# Patient Record
Sex: Male | Born: 1965 | Race: White | Hispanic: No | Marital: Married | Smoking: Never smoker
Health system: Southern US, Community
[De-identification: ages and names within clinical notes are randomized; demographics above are authoritative.]

## PROBLEM LIST (undated history)

## (undated) DIAGNOSIS — J189 Pneumonia, unspecified organism: Secondary | ICD-10-CM

## (undated) HISTORY — PX: EYE SURGERY: SHX253

---

## 2014-03-04 ENCOUNTER — Emergency Department (HOSPITAL_COMMUNITY)
Admission: EM | Admit: 2014-03-04 | Discharge: 2014-03-04 | Disposition: A | Discharge status: Home / Self Care | Payer: BC Managed Care – PPO | Attending: Emergency Medicine | Admitting: Emergency Medicine

## 2014-03-04 ENCOUNTER — Encounter (HOSPITAL_COMMUNITY): Payer: Self-pay | Admitting: Emergency Medicine

## 2014-03-04 ENCOUNTER — Emergency Department (HOSPITAL_COMMUNITY): Payer: BC Managed Care – PPO

## 2014-03-04 DIAGNOSIS — Z8701 Personal history of pneumonia (recurrent): Secondary | ICD-10-CM | POA: Insufficient documentation

## 2014-03-04 DIAGNOSIS — R030 Elevated blood-pressure reading, without diagnosis of hypertension: Secondary | ICD-10-CM | POA: Diagnosis not present

## 2014-03-04 DIAGNOSIS — R42 Dizziness and giddiness: Secondary | ICD-10-CM | POA: Insufficient documentation

## 2014-03-04 DIAGNOSIS — IMO0001 Reserved for inherently not codable concepts without codable children: Secondary | ICD-10-CM

## 2014-03-04 DIAGNOSIS — Z7982 Long term (current) use of aspirin: Secondary | ICD-10-CM | POA: Diagnosis not present

## 2014-03-04 DIAGNOSIS — R079 Chest pain, unspecified: Secondary | ICD-10-CM

## 2014-03-04 HISTORY — DX: Pneumonia, unspecified organism: J18.9

## 2014-03-04 LAB — COMPREHENSIVE METABOLIC PANEL
ALK PHOS: 64 U/L (ref 39–117)
ALT: 26 U/L (ref 0–53)
AST: 18 U/L (ref 0–37)
Albumin: 3.9 g/dL (ref 3.5–5.2)
Anion gap: 12 (ref 5–15)
BILIRUBIN TOTAL: 0.2 mg/dL — AB (ref 0.3–1.2)
BUN: 15 mg/dL (ref 6–23)
CHLORIDE: 105 meq/L (ref 96–112)
CO2: 25 meq/L (ref 19–32)
CREATININE: 0.78 mg/dL (ref 0.50–1.35)
Calcium: 9.1 mg/dL (ref 8.4–10.5)
GFR calc Af Amer: >90 mL/min (ref >90)
Glucose, Bld: 106 mg/dL — ABNORMAL HIGH (ref 70–99)
Potassium: 4.2 mEq/L (ref 3.7–5.3)
Sodium: 142 mEq/L (ref 137–147)
Total Protein: 7.2 g/dL (ref 6.0–8.3)

## 2014-03-04 LAB — CBC
HCT: 40.4 % (ref 39.0–52.0)
Hemoglobin: 13.8 g/dL (ref 13.0–17.0)
MCH: 28.4 pg (ref 26.0–34.0)
MCHC: 34.2 g/dL (ref 30.0–36.0)
MCV: 83.1 fL (ref 78.0–100.0)
PLATELETS: 210 10*3/uL (ref 150–400)
RBC: 4.86 MIL/uL (ref 4.22–5.81)
RDW: 12.5 % (ref 11.5–15.5)
WBC: 8.4 10*3/uL (ref 4.0–10.5)

## 2014-03-04 LAB — I-STAT TROPONIN, ED
Troponin i, poc: 0 ng/mL (ref 0.00–0.08)
Troponin i, poc: 0 ng/mL (ref 0.00–0.08)

## 2014-03-04 MED ORDER — LORAZEPAM 2 MG/ML IJ SOLN
1.0000 mg | Freq: Once | INTRAMUSCULAR | Status: AC
  Administered 2014-03-04: 1 mg via INTRAVENOUS
  Filled 2014-03-04: qty 1

## 2014-03-04 MED ORDER — ONDANSETRON HCL 4 MG/2ML IJ SOLN
4.0000 mg | Freq: Once | INTRAMUSCULAR | Status: AC
  Administered 2014-03-04: 4 mg via INTRAVENOUS
  Filled 2014-03-04: qty 2

## 2014-03-04 MED ORDER — SODIUM CHLORIDE 0.9 % IV BOLUS (SEPSIS)
1000.0000 mL | Freq: Once | INTRAVENOUS | Status: AC
  Administered 2014-03-04: 1000 mL via INTRAVENOUS

## 2014-03-04 MED ORDER — SODIUM CHLORIDE 0.9 % IV SOLN
INTRAVENOUS | Status: DC
  Administered 2014-03-04: 11:00:00 via INTRAVENOUS

## 2014-03-04 MED ORDER — LORAZEPAM 1 MG PO TABS: 1.0000 mg | ORAL_TABLET | Freq: Three times a day (TID) | ORAL | Status: AC | PRN

## 2014-03-04 MED ORDER — GI COCKTAIL ~~LOC~~
30.0000 mL | Freq: Once | ORAL | Status: AC
  Administered 2014-03-04: 30 mL via ORAL
  Filled 2014-03-04: qty 30

## 2014-03-04 NOTE — ED Provider Notes (Signed)
CSN: [id number]     Arrival date & time 03/04/14  0865 History   First MD Initiated Contact with Patient 03/04/14 0930     Chief Complaint  Patient presents with  . Chest Pain     (Consider location/radiation/quality/duration/timing/severity/associated sxs/prior Treatment) HPI  Nathaniel Glover is a 48 y.o. male who presents for evaluation of dizziness and vomiting. He also has a sensation of chest pain, which has been intermittent since last night. He noticed it about 5:30 PM after working in the yard, and cleaning gutters around his house. He is able to eat and took some aspirin in case it was a heart problem. He was awakened at 4:30 PM by a sensation of nausea and dizziness. His chest pain. Also returned at that time. His dizziness was worse with movement of the head. He has a mild headache at this time. His chest pain has been persistent since 4:30 AM. It is mild. He has not had this problem previously. He could work this morning, but began vomiting, so he called an ambulance and came here by EMS. He was treated with IV fluids, and and Zofran. He feels somewhat better after the Zofran. He denies recent cough, weakness, dizziness, back, pain or similar symptoms in the past. There are no other known modifying factors.   Past Medical History  Diagnosis Date  . Pneumonia    History reviewed. No pertinent past surgical history. History reviewed. No pertinent family history. History  Substance Use Topics  . Smoking status: Never Smoker   . Smokeless tobacco: Not on file  . Alcohol Use: No    Review of Systems  All other systems reviewed and are negative.     Allergies  Review of patient's allergies indicates no known allergies.  Home Medications   Prior to Admission medications   Medication Sig Start Date End Date Taking? Authorizing Provider  aspirin 325 MG tablet Take 650 mg by mouth once.   Yes Historical Provider, MD  LORazepam (ATIVAN) 1 MG tablet Take 1 tablet (1 mg total)  by mouth 3 (three) times daily as needed (Dizziness). Do not take within 6 hours of driving, or working 7/84/69   Flint Melter, MD   BP 148/85  Pulse 58  Temp(Src) 97.8 F (36.6 C) (Oral)  Resp 20  Ht  (1.778 m)  Wt 267 lb (121.11 kg)  BMI 38.31 kg/m2  SpO2 97% Physical Exam  Nursing note and vitals reviewed. Constitutional: He is oriented to person, place, and time. He appears well-developed and well-nourished.  HENT:  Head: Normocephalic and atraumatic.  Right Ear: External ear normal.  Left Ear: External ear normal.  Eyes: Conjunctivae and EOM are normal. Pupils are equal, round, and reactive to light.  Neck: Normal range of motion and phonation normal. Neck supple.  Cardiovascular: Normal rate, regular rhythm and normal heart sounds.   Pulmonary/Chest: Effort normal and breath sounds normal. No respiratory distress. He has no rales. He exhibits no tenderness and no bony tenderness.  Abdominal: Soft. There is no tenderness.  Musculoskeletal: Normal range of motion.  Neurological: He is alert and oriented to person, place, and time. No cranial nerve deficit or sensory deficit. He exhibits normal muscle tone. Coordination normal.  No dysarthria, aphasia or nystagmus. Head impulse testing causes dizziness, but not nystagmus.  Skin: Skin is warm, dry and intact.  Psychiatric: He has a normal mood and affect. His behavior is normal. Judgment and thought content normal.    ED Course  Procedures (including critical care time)  Medications  0.9 %  sodium chloride infusion ( Intravenous New Bag/Given 03/04/14 1044)  sodium chloride 0.9 % bolus 1,000 mL (0 mLs Intravenous Stopped 03/04/14 1044)  LORazepam (ATIVAN) injection 1 mg (1 mg Intravenous Given 03/04/14 0949)  gi cocktail (Maalox,Lidocaine,Donnatal) (30 mLs Oral Given 03/04/14 1321)  ondansetron (ZOFRAN) injection 4 mg (4 mg Intravenous Given 03/04/14 1321)    Patient Vitals for the past 24 hrs:  BP Temp Temp src Pulse  Resp SpO2 Height Weight  03/04/14 1400 148/85 mmHg - - 58 20 97 % - -  03/04/14 1330 160/94 mmHg - - 61 17 100 % - -  03/04/14 1300 155/87 mmHg - - 58 21 100 % - -  03/04/14 1230 160/78 mmHg - - 68 18 98 % - -  03/04/14 1200 148/91 mmHg - - 64 12 100 % - -  03/04/14 1045 157/95 mmHg - - 65 19 100 % - -  03/04/14 1000 159/91 mmHg - - 64 11 98 % - -  03/04/14 0945 153/99 mmHg - - 64 12 100 % - -  03/04/14 0930 157/99 mmHg - - 65 13 99 % - -  03/04/14 0925 162/93 mmHg 97.8 F (36.6 C) Oral - 17 100 %  (1.778 m) 267 lb (121.11 kg)    2:47 PM Reevaluation with update and discussion. After initial assessment and treatment, an updated evaluation reveals he is comfortable, no dizziness, or chest pain at this time. Findings discussed with the patient and his wife, all questions answered. [name] L    Labs Review Labs Reviewed  COMPREHENSIVE METABOLIC PANEL - Abnormal; Notable for the following:    Glucose, Bld 106 (*)    Total Bilirubin 0.2 (*)    All other components within normal limits  CBC  I-STAT TROPOININ, ED  Rosezena Sensor, ED    Imaging Review Dg Chest 2 View  03/04/2014   CLINICAL DATA:  Left chest pain.  EXAM: CHEST  2 VIEW  COMPARISON:  None.  FINDINGS: Mediastinum and hilar structures are normal. Heart size and pulmonary vascularity normal. Lungs are clear. No pleural effusion or pneumothorax. Prominent degenerative changes thoracic spine. No acute bony abnormality.  IMPRESSION: 1. No acute cardiopulmonary disease. 2. Prominent degenerative changes thoracic spine.   Electronically Signed   By: Maisie Fus  Register   On: 03/04/2014 12:02     EKG Interpretation   Date/Time:  Monday March 04 2014 09:27:02 EDT Ventricular Rate:  61 PR Interval:  188 QRS Duration: 93 QT Interval:  389 QTC Calculation: 392 R Axis:   63 Text Interpretation:  Sinus rhythm Minimal ST elevation, inferior leads No  old tracing to compare Confirmed by [facility]  MD, Joshawn Crissman [phone]) on   03/04/2014 9:31:10 AM      MDM   Final diagnoses:  Chest pain, unspecified chest pain type  Dizziness  Elevated blood pressure    Nonspecific dizziness, and chest pain. Incidental hypertension that appears unrelated. I don't see any ACS, metabolic instability, or serious bacterial infection.  Nursing Notes Reviewed/ Care Coordinated Applicable Imaging Reviewed Interpretation of Laboratory Data incorporated into ED treatment  The patient appears reasonably screened and/or stabilized for discharge and I doubt any other medical condition or other [facility] requiring further screening, evaluation, or treatment in the ED at this time prior to discharge.  Plan: Home Medications- Ativan; Home Treatments- rest; return here if the recommended treatment, does not improve the symptoms; Recommended follow up- PCP of  choice 2 weeks for BP check and f/u care    Flint Melter, MD 03/04/14 [phone]

## 2014-03-04 NOTE — Discharge Instructions (Signed)
Try to get plenty of rest, eat and drink regularly. Do not use the medication for dizziness within 6 hours of driving. Followup with a primary care Dr., for a blood pressure check, in 2 or 3 weeks.     Chest Pain (Nonspecific) It is often hard to give a specific diagnosis for the cause of chest pain. There is always a chance that your pain could be related to something serious, such as a heart attack or a blood clot in the lungs. You need to follow up with your health care provider for further evaluation. CAUSES   Heartburn.  Pneumonia or bronchitis.  Anxiety or stress.  Inflammation around your heart (pericarditis) or lung (pleuritis or pleurisy).  A blood clot in the lung.  A collapsed lung (pneumothorax). It can develop suddenly on its own (spontaneous pneumothorax) or from trauma to the chest.  Shingles infection (herpes zoster virus). The chest wall is composed of bones, muscles, and cartilage. Any of these can be the source of the pain.  The bones can be bruised by injury.  The muscles or cartilage can be strained by coughing or overwork.  The cartilage can be affected by inflammation and become sore (costochondritis). DIAGNOSIS  Lab tests or other studies may be needed to find the cause of your pain. Your health care provider may have you take a test called an ambulatory electrocardiogram (ECG). An ECG records your heartbeat patterns over a 24-hour period. You may also have other tests, such as:  Transthoracic echocardiogram (TTE). During echocardiography, sound waves are used to evaluate how blood flows through your heart.  Transesophageal echocardiogram (TEE).  Cardiac monitoring. This allows your health care provider to monitor your heart rate and rhythm in real time.  Holter monitor. This is a portable device that records your heartbeat and can help diagnose heart arrhythmias. It allows your health care provider to track your heart activity for several days, if  needed.  Stress tests by exercise or by giving medicine that makes the heart beat faster. TREATMENT   Treatment depends on what may be causing your chest pain. Treatment may include:  Acid blockers for heartburn.  Anti-inflammatory medicine.  Pain medicine for inflammatory conditions.  Antibiotics if an infection is present.  You may be advised to change lifestyle habits. This includes stopping smoking and avoiding alcohol, caffeine, and chocolate.  You may be advised to keep your head raised (elevated) when sleeping. This reduces the chance of acid going backward from your stomach into your esophagus. Most of the time, nonspecific chest pain will improve within 2-3 days with rest and mild pain medicine.  HOME CARE INSTRUCTIONS   If antibiotics were prescribed, take them as directed. Finish them even if you start to feel better.  For the next few days, avoid physical activities that bring on chest pain. Continue physical activities as directed.  Do not use any tobacco products, including cigarettes, chewing tobacco, or electronic cigarettes.  Avoid drinking alcohol.  Only take medicine as directed by your health care provider.  Follow your health care provider's suggestions for further testing if your chest pain does not go away.  Keep any follow-up appointments you made. If you do not go to an appointment, you could develop lasting (chronic) problems with pain. If there is any problem keeping an appointment, call to reschedule. SEEK MEDICAL CARE IF:   Your chest pain does not go away, even after treatment.  You have a rash with blisters on your chest.  You  have a fever. SEEK IMMEDIATE MEDICAL CARE IF:   You have increased chest pain or pain that spreads to your arm, neck, jaw, back, or abdomen.  You have shortness of breath.  You have an increasing cough, or you cough up blood.  You have severe back or abdominal pain.  You feel nauseous or vomit.  You have severe  weakness.  You faint.  You have chills. This is an emergency. Do not wait to see if the pain will go away. Get medical help at once. Call your local emergency services (911 in [place] Do not drive yourself to the hospital. MAKE SURE YOU:   Understand these instructions.  Will watch your condition.  Will get help right away if you are not doing well or get worse. Document Released: 03/10/2005 Document Revised: 06/05/2013 Document Reviewed: 01/04/2008 [facility] Patient Information 2015 [place], [place]. This information is not intended to replace advice given to you by your health care provider. Make sure you discuss any questions you have with your health care provider.  Dizziness Dizziness is a common problem. It is a feeling of unsteadiness or light-headedness. You may feel like you are about to faint. Dizziness can lead to injury if you stumble or fall. A person of any age group can suffer from dizziness, but dizziness is more common in older adults. CAUSES  Dizziness can be caused by many different things, including:  Middle ear problems.  Standing for too long.  Infections.  An allergic reaction.  Aging.  An emotional response to something, such as the sight of blood.  Side effects of medicines.  Tiredness.  Problems with circulation or blood pressure.  Excessive use of alcohol or medicines, or illegal drug use.  Breathing too fast (hyperventilation).  An irregular heart rhythm (arrhythmia).  A low red blood cell count (anemia).  Pregnancy.  Vomiting, diarrhea, fever, or other illnesses that cause body fluid loss (dehydration).  Diseases or conditions such as Parkinson's disease, high blood pressure (hypertension), diabetes, and thyroid problems.  Exposure to extreme heat. DIAGNOSIS  Your health care provider will ask about your symptoms, perform a physical exam, and perform an electrocardiogram (ECG) to record the electrical activity of your heart. Your health  care provider may also perform other heart or blood tests to determine the cause of your dizziness. These may include:  Transthoracic echocardiogram (TTE). During echocardiography, sound waves are used to evaluate how blood flows through your heart.  Transesophageal echocardiogram (TEE).  Cardiac monitoring. This allows your health care provider to monitor your heart rate and rhythm in real time.  Holter monitor. This is a portable device that records your heartbeat and can help diagnose heart arrhythmias. It allows your health care provider to track your heart activity for several days if needed.  Stress tests by exercise or by giving medicine that makes the heart beat faster. TREATMENT  Treatment of dizziness depends on the cause of your symptoms and can vary greatly. HOME CARE INSTRUCTIONS   Drink enough fluids to keep your urine clear or pale yellow. This is especially important in very hot weather. In older adults, it is also important in cold weather.  Take your medicine exactly as directed if your dizziness is caused by medicines. When taking blood pressure medicines, it is especially important to get up slowly.  Rise slowly from chairs and steady yourself until you feel okay.  In the morning, first sit up on the side of the bed. When you feel okay, stand slowly while  holding onto something until you know your balance is fine.  Move your legs often if you need to stand in one place for a long time. Tighten and relax your muscles in your legs while standing.  Have someone stay with you for 1-2 days if dizziness continues to be a problem. Do this until you feel you are well enough to stay alone. Have the person call your health care provider if he or she notices changes in you that are concerning.  Do not drive or use heavy machinery if you feel dizzy.  Do not drink alcohol. SEEK IMMEDIATE MEDICAL CARE IF:   Your dizziness or light-headedness gets worse.  You feel nauseous or  vomit.  You have problems talking, walking, or using your arms, hands, or legs.  You feel weak.  You are not thinking clearly or you have trouble forming sentences. It may take a friend or family member to notice this.  You have chest pain, abdominal pain, shortness of breath, or sweating.  Your vision changes.  You notice any bleeding.  You have side effects from medicine that seems to be getting worse rather than better. MAKE SURE YOU:   Understand these instructions.  Will watch your condition.  Will get help right away if you are not doing well or get worse. Document Released: 11/24/2000 Document Revised: 06/05/2013 Document Reviewed: 12/18/2010 [facility] Patient Information 2015 [place], [place]. This information is not intended to replace advice given to you by your health care provider. Make sure you discuss any questions you have with your health care provider.  Hypertension Hypertension, commonly called high blood pressure, is when the force of blood pumping through your arteries is too strong. Your arteries are the blood vessels that carry blood from your heart throughout your body. A blood pressure reading consists of a higher number over a lower number, such as 110/72. The higher number (systolic) is the pressure inside your arteries when your heart pumps. The lower number (diastolic) is the pressure inside your arteries when your heart relaxes. Ideally you want your blood pressure below 120/80. Hypertension forces your heart to work harder to pump blood. Your arteries may become narrow or stiff. Having hypertension puts you at risk for heart disease, stroke, and other problems.  RISK FACTORS Some risk factors for high blood pressure are controllable. Others are not.  Risk factors you cannot control include:   Race. You may be at higher risk if you are African American.  Age. Risk increases with age.  Gender. Men are at higher risk than women before age 43 years. After  age 13, women are at higher risk than men. Risk factors you can control include:  Not getting enough exercise or physical activity.  Being overweight.  Getting too much fat, sugar, calories, or salt in your diet.  Drinking too much alcohol. SIGNS AND SYMPTOMS Hypertension does not usually cause signs or symptoms. Extremely high blood pressure (hypertensive crisis) may cause headache, anxiety, shortness of breath, and nosebleed. DIAGNOSIS  To check if you have hypertension, your health care provider will measure your blood pressure while you are seated, with your arm held at the level of your heart. It should be measured at least twice using the same arm. Certain conditions can cause a difference in blood pressure between your right and left arms. A blood pressure reading that is higher than normal on one occasion does not mean that you need treatment. If one blood pressure reading is high, ask your health  care provider about having it checked again. TREATMENT  Treating high blood pressure includes making lifestyle changes and possibly taking medicine. Living a healthy lifestyle can help lower high blood pressure. You may need to change some of your habits. Lifestyle changes may include:  Following the DASH diet. This diet is high in fruits, vegetables, and whole grains. It is low in salt, red meat, and added sugars.  Getting at least 2 hours of brisk physical activity every week.  Losing weight if necessary.  Not smoking.  Limiting alcoholic beverages.  Learning ways to reduce stress. If lifestyle changes are not enough to get your blood pressure under control, your health care provider may prescribe medicine. You may need to take more than one. Work closely with your health care provider to understand the risks and benefits. HOME CARE INSTRUCTIONS  Have your blood pressure rechecked as directed by your health care provider.   Take medicines only as directed by your health care  provider. Follow the directions carefully. Blood pressure medicines must be taken as prescribed. The medicine does not work as well when you skip doses. Skipping doses also puts you at risk for problems.   Do not smoke.   Monitor your blood pressure at home as directed by your health care provider. SEEK MEDICAL CARE IF:   You think you are having a reaction to medicines taken.  You have recurrent headaches or feel dizzy.  You have swelling in your ankles.  You have trouble with your vision. SEEK IMMEDIATE MEDICAL CARE IF:  You develop a severe headache or confusion.  You have unusual weakness, numbness, or feel faint.  You have severe chest or abdominal pain.  You vomit repeatedly.  You have trouble breathing. MAKE SURE YOU:   Understand these instructions.  Will watch your condition.  Will get help right away if you are not doing well or get worse. Document Released: 05/31/2005 Document Revised: 10/15/2013 Document Reviewed: 03/23/2013 [facility] Patient Information 2015 [place], [place]. This information is not intended to replace advice given to you by your health care provider. Make sure you discuss any questions you have with your health care provider.

## 2014-03-04 NOTE — ED Notes (Signed)
Pt started having chest pain last night around 5:30pm, pain radiating to left arm. Pt went to bed, then woke up with same chest pain. Nausea and dizziness. Pain 8/10.  ASA given.  zofran from EMS- no relief. BP 157/96, HR 61, 100% on room air. EKG shows SR

## 2016-09-03 ENCOUNTER — Emergency Department (HOSPITAL_COMMUNITY): Payer: Worker's Compensation

## 2016-09-03 ENCOUNTER — Emergency Department (HOSPITAL_COMMUNITY)
Admission: EM | Admit: 2016-09-03 | Discharge: 2016-09-03 | Disposition: A | Discharge status: Home / Self Care | Payer: Worker's Compensation | Attending: Emergency Medicine | Admitting: Emergency Medicine

## 2016-09-03 ENCOUNTER — Encounter (HOSPITAL_COMMUNITY): Payer: Self-pay | Admitting: Emergency Medicine

## 2016-09-03 DIAGNOSIS — Y929 Unspecified place or not applicable: Secondary | ICD-10-CM | POA: Insufficient documentation

## 2016-09-03 DIAGNOSIS — Y990 Civilian activity done for income or pay: Secondary | ICD-10-CM | POA: Insufficient documentation

## 2016-09-03 DIAGNOSIS — Z7982 Long term (current) use of aspirin: Secondary | ICD-10-CM | POA: Insufficient documentation

## 2016-09-03 DIAGNOSIS — Y939 Activity, unspecified: Secondary | ICD-10-CM | POA: Insufficient documentation

## 2016-09-03 DIAGNOSIS — S62661A Nondisplaced fracture of distal phalanx of left index finger, initial encounter for closed fracture: Secondary | ICD-10-CM | POA: Diagnosis not present

## 2016-09-03 DIAGNOSIS — S60222A Contusion of left hand, initial encounter: Secondary | ICD-10-CM

## 2016-09-03 DIAGNOSIS — W228XXA Striking against or struck by other objects, initial encounter: Secondary | ICD-10-CM | POA: Insufficient documentation

## 2016-09-03 DIAGNOSIS — S6992XA Unspecified injury of left wrist, hand and finger(s), initial encounter: Secondary | ICD-10-CM | POA: Diagnosis present

## 2016-09-03 MED ORDER — TETANUS-DIPHTH-ACELL PERTUSSIS 5-2.5-18.5 LF-MCG/0.5 IM SUSP
0.5000 mL | Freq: Once | INTRAMUSCULAR | Status: DC
  Filled 2016-09-03: qty 0.5

## 2016-09-03 MED ORDER — DICLOFENAC SODIUM 50 MG PO TBEC: 50.0000 mg | DELAYED_RELEASE_TABLET | Freq: Two times a day (BID) | ORAL | 0 refills | Status: AC

## 2016-09-03 MED ORDER — TETANUS-DIPHTH-ACELL PERTUSSIS 5-2.5-18.5 LF-MCG/0.5 IM SUSP
0.5000 mL | Freq: Once | INTRAMUSCULAR | Status: AC
  Administered 2016-09-03: 0.5 mL via INTRAMUSCULAR

## 2016-09-03 MED ORDER — IBUPROFEN 400 MG PO TABS
600.0000 mg | ORAL_TABLET | Freq: Once | ORAL | Status: AC
  Administered 2016-09-03: 600 mg via ORAL
  Filled 2016-09-03: qty 1

## 2016-09-03 NOTE — Discharge Instructions (Signed)
Follow up with Dr. Gramig  

## 2016-09-03 NOTE — ED Provider Notes (Signed)
[facility]-EMERGENCY DEPT Provider Note   CSN: [id number] Arrival date & time: 09/03/16  1558  By signing my name below, I, Nelwyn Salisbury, attest that this documentation has been prepared under the direction and in the presence of non-physician practitioner, Kerrie Buffalo, NP. Electronically Signed: Nelwyn Salisbury, Scribe. 09/03/2016. 4:19 PM.  History   Chief Complaint Chief Complaint  Patient presents with  . Hand Injury   The history is provided by the patient. No language interpreter was used.  Hand Injury   The incident occurred 3 to 5 hours ago. The incident occurred at work. The injury mechanism was compression. The pain is present in the left hand. The pain is moderate. The pain has been constant since the incident. The symptoms are aggravated by movement, use and palpation. He has tried nothing for the symptoms. The treatment provided no relief.    HPI Comments:  Nathaniel Glover is a 51 y.o. male with no pertinent pmhx who presents to the Emergency Department complaining of constant, moderate left-hand pain after slamming his hand into a truck tailgate earlier today. Pt states that his pain is localized around his left index and middle fingers worsened with use and palpation. He reports associated swelling, paraesthesia and wound. Pt has not tried anything PTA for his pain. He denies any weakness or numbness.  Past Medical History:  Diagnosis Date  . Pneumonia     There are no active problems to display for this patient.   Past Surgical History:  Procedure Laterality Date  . EYE SURGERY         Home Medications    Prior to Admission medications   Medication Sig Start Date End Date Taking? Authorizing Provider  aspirin 325 MG tablet Take 650 mg by mouth once.    Historical Provider, MD  diclofenac (VOLTAREN) 50 MG EC tablet Take 1 tablet (50 mg total) by mouth 2 (two) times daily. 09/03/16   Gimena Buick Orlene Och, NP  LORazepam (ATIVAN) 1 MG tablet Take 1 tablet (1 mg total) by mouth 3  (three) times daily as needed (Dizziness). Do not take within 6 hours of driving, or working 09/21/79   Mancel Bale, MD    Family History History reviewed. No pertinent family history.  Social History Social History  Substance Use Topics  . Smoking status: Never Smoker  . Smokeless tobacco: Never Used  . Alcohol use No     Allergies   Patient has no known allergies.   Review of Systems Review of Systems  Gastrointestinal: Negative for nausea and vomiting.  Musculoskeletal: Positive for arthralgias and joint swelling.  Skin: Positive for wound.  Neurological: Negative for weakness and numbness.       Tingling of fingers.     Physical Exam Updated Vital Signs BP (!) 162/89 (BP Location: Left Arm)   Pulse 77   Temp 98.6 F (37 C) (Oral)   Resp 18   Ht 5\' 11"  (1.803 m)   Wt 106.6 kg   SpO2 95%   BMI 32.78 kg/m   Physical Exam  Constitutional: He appears well-developed and well-nourished. No distress.  HENT:  Head: Normocephalic and atraumatic.  Eyes: Conjunctivae are normal.  Cardiovascular: Normal rate.   Radial pulse 2+  Pulmonary/Chest: Effort normal.  Abdominal: He exhibits no distension.  Musculoskeletal: He exhibits tenderness. He exhibits no deformity.       Left hand: He exhibits tenderness and swelling. He exhibits normal capillary refill and no deformity. Normal sensation noted. Normal strength noted.  Swelling  and tenderness noted to left index and left long finger. Tip of long finger with area of erythema that appears as a blood blister. Limited flexion of fingers most likely due to swelling vs tendon injury. Small abrasion of finger noted.  Neurological: He is alert.  Skin: Skin is warm and dry.  Adequate circulation to index and long finger.   Psychiatric: He has a normal mood and affect.  Nursing note and vitals reviewed.    ED Treatments / Results  DIAGNOSTIC STUDIES:  Oxygen Saturation is 96% on RA, adequate by my interpretation.     COORDINATION OF CARE:  5:32 PM Discussed treatment plan with pt at bedside which includes splint and referral to hand surgeon and pt agreed to plan.  Labs (all labs ordered are listed, but only abnormal results are displayed) Labs Reviewed - No data to display  Radiology Dg Finger Index Left  Result Date: 09/03/2016 CLINICAL DATA:  Cut finger stuck in tail gait of truck EXAM: LEFT INDEX FINGER 2+V COMPARISON:  None. FINDINGS: Possible tiny fracture or at the base of the second distal phalanx. No subluxation. No radiopaque foreign body. IMPRESSION: Possible tiny fracture involving the base of the first distal phalanx. Electronically Signed   By: Jasmine PangKim  Fujinaga M.D.   On: 09/03/2016 17:11   Dg Finger Middle Left  Result Date: 09/03/2016 CLINICAL DATA:  Got fingers stuck in tail gait EXAM: LEFT MIDDLE FINGER 2+V COMPARISON:  None. FINDINGS: No fracture or malalignment. There is a probable cyst in the head of the third middle phalanx. No radiopaque foreign body. IMPRESSION: No definite acute osseous abnormality Electronically Signed   By: Jasmine PangKim  Fujinaga M.D.   On: 09/03/2016 17:48    Procedures Procedures (including critical care time)  Medications Ordered in ED Medications  ibuprofen (ADVIL,MOTRIN) tablet 600 mg (600 mg Oral Given 09/03/16 1725)  Tdap (BOOSTRIX) injection 0.5 mL (0.5 mLs Intramuscular Given 09/03/16 1738)     Initial Impression / Assessment and Plan / ED Course  I have reviewed the triage vital signs and the nursing notes.  Pertinent imaging results that were available during my care of the patient were reviewed by me and considered in my medical decision making (see chart for details).  Patient X-Ray positive for fracture.  Pt advised to follow up with orthopedics. Patient given splint while in ED, conservative therapy recommended and discussed. Patient referred to hand surgeon for follow-up. Patient will be discharged home & is agreeable with above plan. Returns  precautions discussed. Pt appears safe for discharge.  Final Clinical Impressions(s) / ED Diagnoses   Final diagnoses:  Closed nondisplaced fracture of distal phalanx of left index finger, initial encounter  Contusion of left hand, initial encounter    New Prescriptions Discharge Medication List as of 09/03/2016  5:55 PM    START taking these medications   Details  diclofenac (VOLTAREN) 50 MG EC tablet Take 1 tablet (50 mg total) by mouth 2 (two) times daily., Starting Fri 09/03/2016, Print      I personally performed the services described in this documentation, which was scribed in my presence. The recorded information has been reviewed and is accurate.     [address]Parminder Trapani [place] Lake[place] Karsyn Jamie, NP 09/04/16 0301    Jerelyn ScottMartha Linker, MD 09/04/16 [phone]

## 2016-09-03 NOTE — ED Triage Notes (Signed)
Pt got left index and third finger stuck in the tailgate of truck. Pt able to move fingers. No deformity noted. Pt states they feel numb at times.
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
## 2019-08-23 ENCOUNTER — Ambulatory Visit: Payer: Self-pay | Attending: Internal Medicine

## 2019-11-02 ENCOUNTER — Emergency Department (HOSPITAL_COMMUNITY)
Admission: EM | Admit: 2019-11-02 | Discharge: 2019-11-02 | Disposition: A | Discharge status: Home / Self Care | Payer: No Typology Code available for payment source | Attending: Emergency Medicine | Admitting: Emergency Medicine

## 2019-11-02 ENCOUNTER — Encounter (HOSPITAL_COMMUNITY): Payer: Self-pay | Admitting: *Deleted

## 2019-11-02 ENCOUNTER — Other Ambulatory Visit: Payer: Self-pay

## 2019-11-02 ENCOUNTER — Emergency Department (HOSPITAL_COMMUNITY): Payer: No Typology Code available for payment source

## 2019-11-02 DIAGNOSIS — Y92410 Unspecified street and highway as the place of occurrence of the external cause: Secondary | ICD-10-CM | POA: Diagnosis not present

## 2019-11-02 DIAGNOSIS — Y990 Civilian activity done for income or pay: Secondary | ICD-10-CM | POA: Diagnosis not present

## 2019-11-02 DIAGNOSIS — S40812A Abrasion of left upper arm, initial encounter: Secondary | ICD-10-CM | POA: Insufficient documentation

## 2019-11-02 DIAGNOSIS — Y9389 Activity, other specified: Secondary | ICD-10-CM | POA: Insufficient documentation

## 2019-11-02 DIAGNOSIS — S59912A Unspecified injury of left forearm, initial encounter: Secondary | ICD-10-CM | POA: Diagnosis present

## 2019-11-02 DIAGNOSIS — W208XXA Other cause of strike by thrown, projected or falling object, initial encounter: Secondary | ICD-10-CM | POA: Insufficient documentation
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
## 2019-11-02 NOTE — ED Notes (Signed)
Patient states he called his wife she is coming

## 2019-11-02 NOTE — ED Notes (Signed)
Patient Alert and oriented to baseline. Stable and ambulatory to baseline. Patient verbalized understanding of the discharge instructions.  Patient belongings were taken by the patient.   
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
## 2023-02-17 DIAGNOSIS — I491 Atrial premature depolarization: Secondary | ICD-10-CM | POA: Diagnosis not present

## 2023-02-18 DIAGNOSIS — I517 Cardiomegaly: Secondary | ICD-10-CM | POA: Diagnosis not present

## 2023-02-19 DIAGNOSIS — R079 Chest pain, unspecified: Secondary | ICD-10-CM | POA: Diagnosis not present
